# Patient Record
Sex: Male | Born: 1935 | Race: White | Hispanic: No | Marital: Married
Health system: Southern US, Community
[De-identification: ages and names within clinical notes are randomized; demographics above are authoritative.]

---

## 2014-03-18 LAB — COMPREHENSIVE METABOLIC PANEL
ALT: 18 U/L (ref 12–78)
Albumin: 2.8 g/dL — ABNORMAL LOW (ref 3.4–5.0)
Alkaline Phosphatase: 52 U/L
Anion Gap: 5 — ABNORMAL LOW (ref 7–16)
BILIRUBIN TOTAL: 0.3 mg/dL (ref 0.2–1.0)
BUN: 22 mg/dL — ABNORMAL HIGH (ref 7–18)
CALCIUM: 8.8 mg/dL (ref 8.5–10.1)
CHLORIDE: 106 mmol/L (ref 98–107)
CREATININE: 1.22 mg/dL (ref 0.60–1.30)
Co2: 31 mmol/L (ref 21–32)
EGFR (African American): >60
GFR CALC NON AF AMER: 57 — AB
Glucose: 109 mg/dL — ABNORMAL HIGH (ref 65–99)
OSMOLALITY: 287 (ref 275–301)
POTASSIUM: 4 mmol/L (ref 3.5–5.1)
SGOT(AST): 21 U/L (ref 15–37)
Sodium: 142 mmol/L (ref 136–145)
TOTAL PROTEIN: 7.3 g/dL (ref 6.4–8.2)

## 2014-03-18 LAB — CBC WITH DIFFERENTIAL/PLATELET
BASOS ABS: 0 10*3/uL (ref 0.0–0.1)
Basophil %: 0.6 %
EOS ABS: 0.3 10*3/uL (ref 0.0–0.7)
Eosinophil %: 4.2 %
HCT: 45.2 % (ref 40.0–52.0)
HGB: 14.9 g/dL (ref 13.0–18.0)
LYMPHS ABS: 1.3 10*3/uL (ref 1.0–3.6)
Lymphocyte %: 17.2 %
MCH: 29.6 pg (ref 26.0–34.0)
MCHC: 32.9 g/dL (ref 32.0–36.0)
MCV: 90 fL (ref 80–100)
Monocyte #: 0.9 x10 3/mm (ref 0.2–1.0)
Monocyte %: 12.3 %
NEUTROS PCT: 65.7 %
Neutrophil #: 5 10*3/uL (ref 1.4–6.5)
PLATELETS: 180 10*3/uL (ref 150–440)
RBC: 5.03 10*6/uL (ref 4.40–5.90)
RDW: 16.6 % — ABNORMAL HIGH (ref 11.5–14.5)
WBC: 7.6 10*3/uL (ref 3.8–10.6)

## 2014-03-18 LAB — URINALYSIS, COMPLETE
BACTERIA: NONE SEEN
Bilirubin,UR: NEGATIVE
GLUCOSE, UR: NEGATIVE mg/dL (ref 0–75)
Ketone: NEGATIVE
Leukocyte Esterase: NEGATIVE
Nitrite: NEGATIVE
PH: 5 (ref 4.5–8.0)
Protein: 100
SPECIFIC GRAVITY: 1.016 (ref 1.003–1.030)
Squamous Epithelial: NONE SEEN
WBC UR: 1 /HPF (ref 0–5)

## 2014-03-18 LAB — TSH: THYROID STIMULATING HORM: 3.38 u[IU]/mL

## 2014-03-18 LAB — CK-MB: CK-MB: 3.1 ng/mL (ref 0.5–3.6)

## 2014-03-18 LAB — TROPONIN I
Troponin-I: <0.02 ng/mL
Troponin-I: 0.05 ng/mL

## 2014-03-18 LAB — D-DIMER(ARMC): D-Dimer: 1696 ng/ml

## 2014-03-19 ENCOUNTER — Inpatient Hospital Stay: Payer: Self-pay | Admitting: Student

## 2014-03-19 LAB — CK-MB: CK-MB: 3.3 ng/mL (ref 0.5–3.6)

## 2014-03-19 LAB — TROPONIN I: Troponin-I: 0.04 ng/mL

## 2014-03-20 ENCOUNTER — Ambulatory Visit (HOSPITAL_COMMUNITY)
Admission: AD | Admit: 2014-03-20 | Discharge: 2014-03-20 | Disposition: A | Discharge status: Home / Self Care | Payer: Medicare Other | Source: Other Acute Inpatient Hospital | Attending: Student | Admitting: Student

## 2014-03-20 DIAGNOSIS — R0902 Hypoxemia: Secondary | ICD-10-CM | POA: Insufficient documentation

## 2015-01-14 IMAGING — CT CT ANGIO CHEST
1 of 2 series · 17 of 30 positions shown · IV contrast (APPLIED)
Comparison: Chest radiograph dated 03/18/2014

ADDENDUM:
Enlargement of the ascending thoracic aorta, measuring up to 4.8 cm.
CLINICAL DATA: 78-year-old with dementia and low oxygen saturation.

EXAM:
CT ANGIOGRAPHY CHEST WITH CONTRAST
TECHNIQUE: Multidetector CT imaging of the chest was performed using the
standard protocol during bolus administration of intravenous
contrast. Multiplanar CT image reconstructions and MIPs were
obtained to evaluate the vascular anatomy.
CONTRAST:  100 mL Isovue 370

[Series 5: pe 1.0 thins · axial · 0.78mm/px · z∈[-526,-253]mm · 17 of 309 slices shown]
[im 18/309  lung]
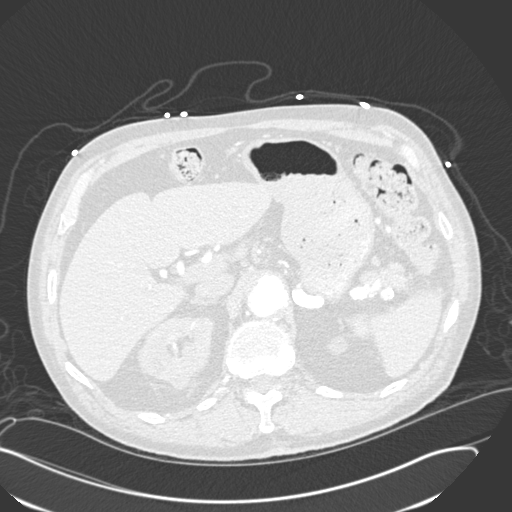
[im 35/309  mediastinal]
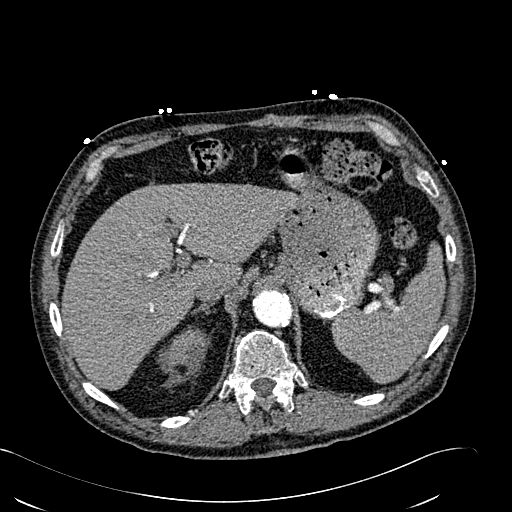
[im 52/309  lung]
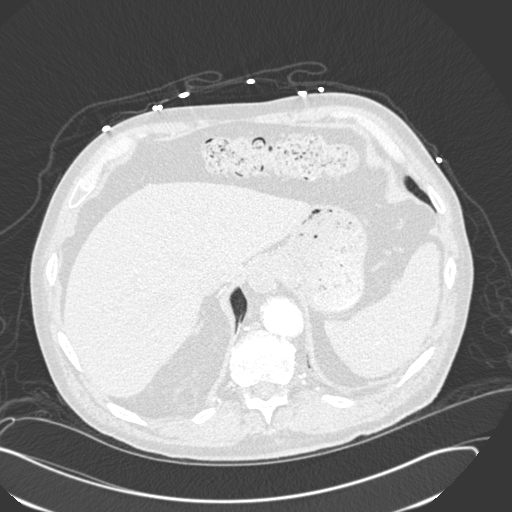
[im 69/309  mediastinal]
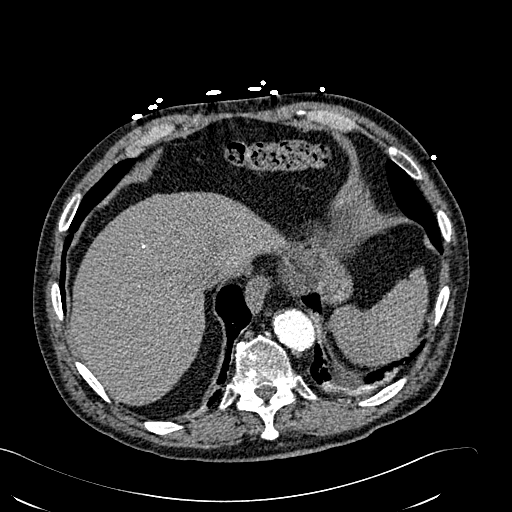
[im 86/309  lung]
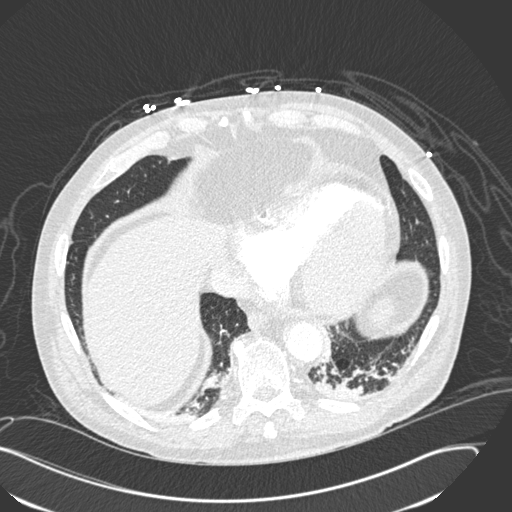
[im 103/309  mediastinal]
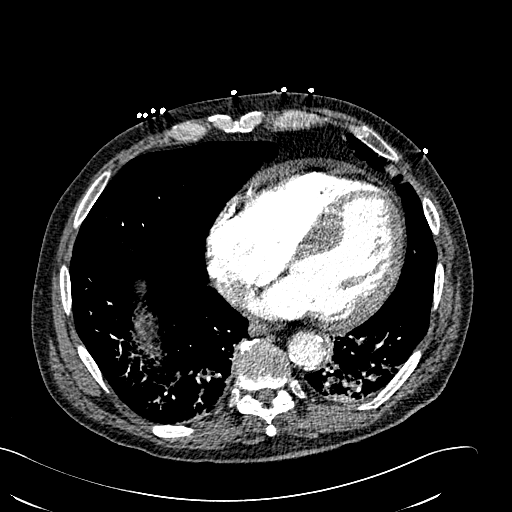
[im 120/309  lung]
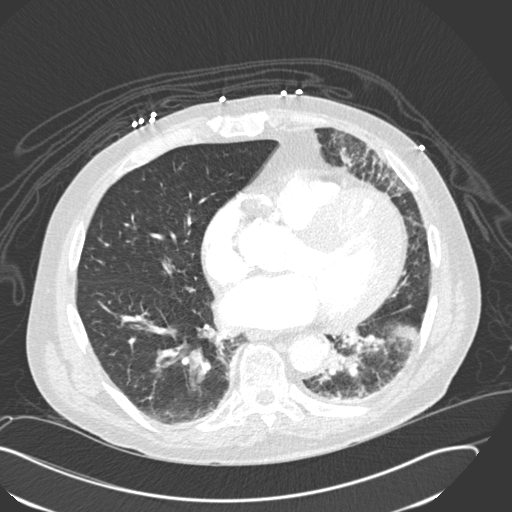
[im 137/309  mediastinal]
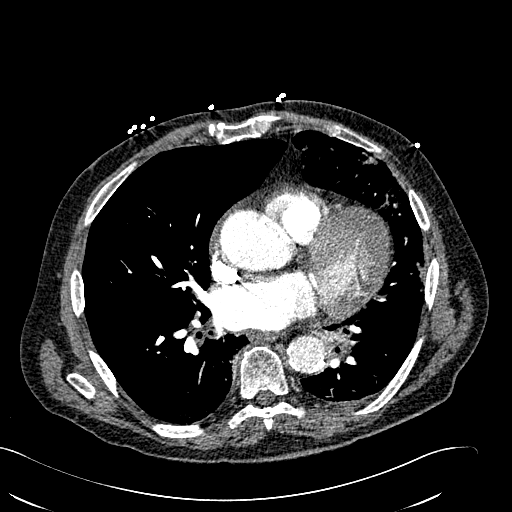
[im 155/309  lung]
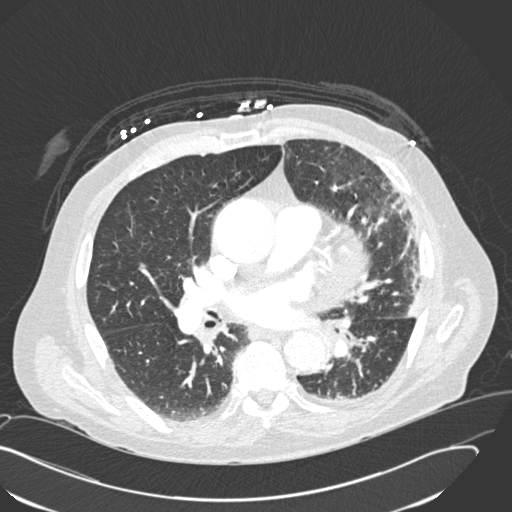
[im 172/309  mediastinal]
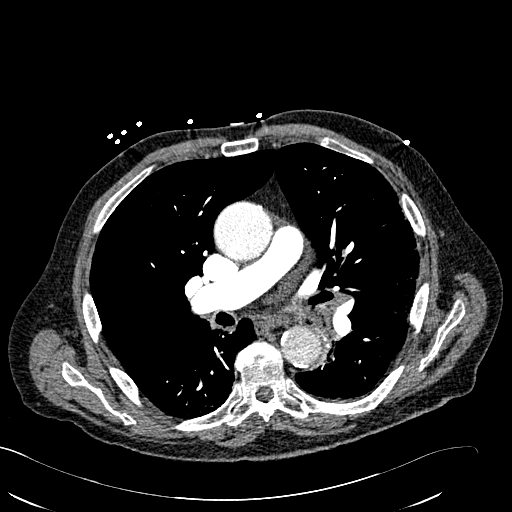
[im 189/309  lung]
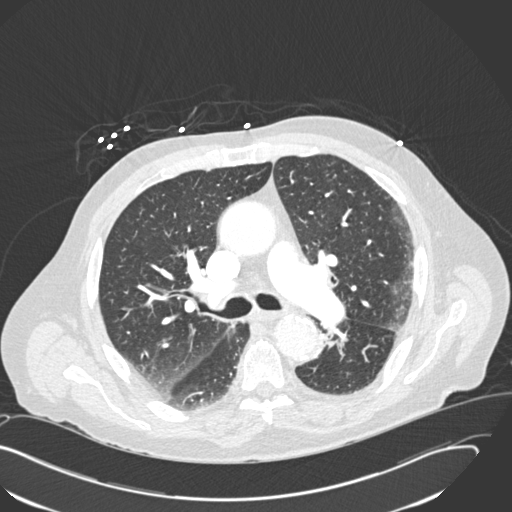
[im 206/309  mediastinal]
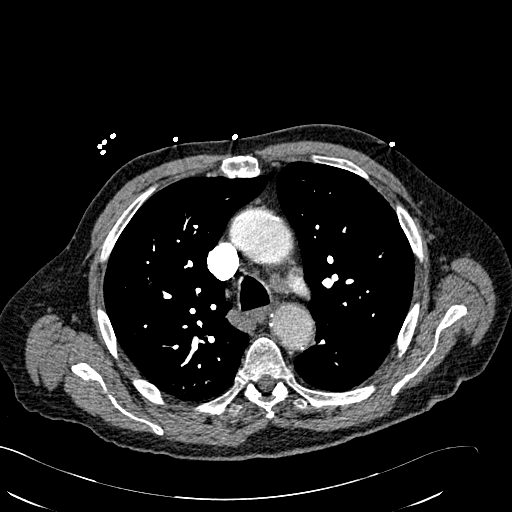
[im 223/309  lung]
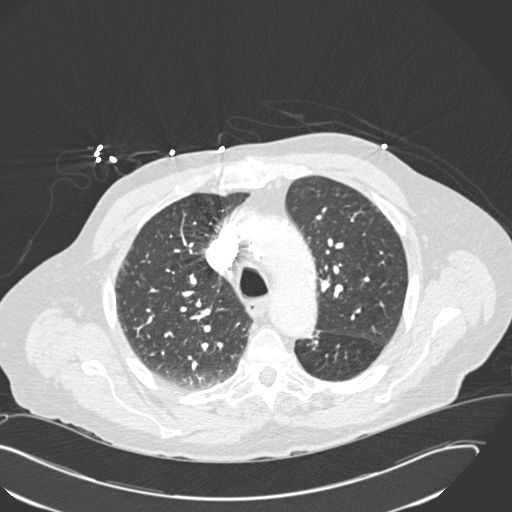
[im 240/309  mediastinal]
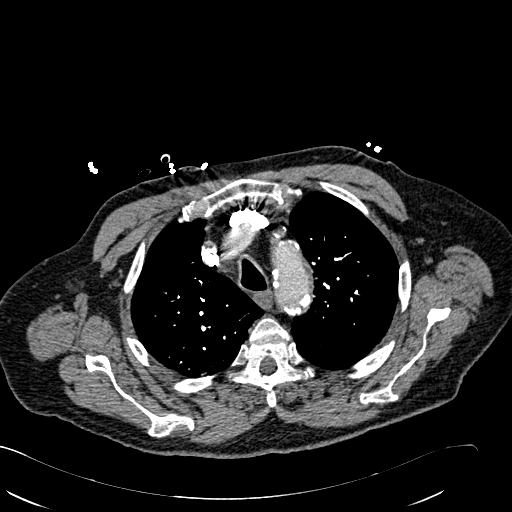
[im 257/309  lung]
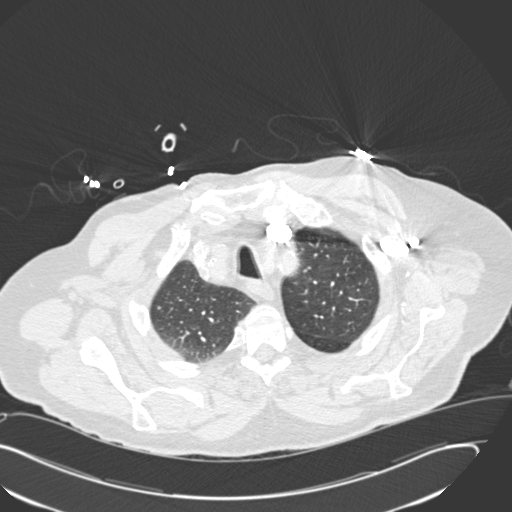
[im 274/309  mediastinal]
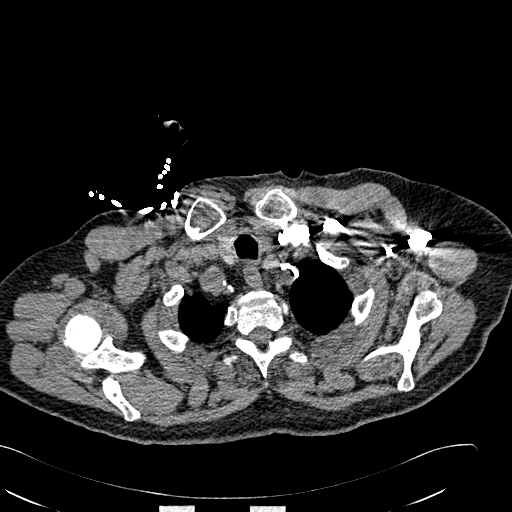
[im 291/309  lung]
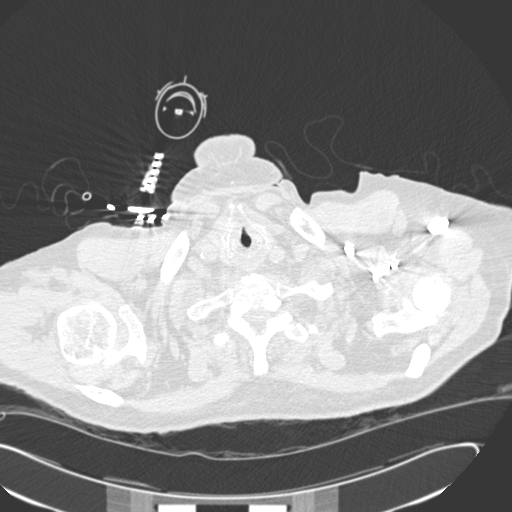

[17 of 30 positions shown; findings below may reference images not displayed]

FINDINGS: The pulmonary arteries are well opacified on this examination. There
is no evidence for a pulmonary embolism. The coronary arteries are
heavily calcified. The ascending thoracic aorta is enlarged,
measuring up to 4.8 cm. Aortic arch roughly measures 3.7 cm in
diameter. The proximal descending thoracic aorta measures 3.3 cm.
Small amount of pericardial fluid. No significant pleural fluid.
Calcified gallstone is present. Low-density structure in left kidney
upper pole is incompletely visualized but probably represents a
cyst. There are at least 3 low-density structures in the right
kidney upper pole which could represent cysts. However, there is a
hyperdense exophytic lesion in the right kidney upper pole that 38
Hounsfield units and measures 1.6 cm. This is a complex lesion and
neoplasm cannot be excluded. There may also be a complex lesion
along the posterior upper pole of the right kidney on sequence 4,
image 146.

The trachea and mainstem bronchi are patent. There is a lesion along
the superior right major fissure and along the posterior right upper
lobe. This structure measures up to 1.1 cm on the axial images but
appears to be more linear or flattened on the coronal and sagittal
reformats. 3 mm nodule in the right upper lobe on sequence 6, image
39. There is peribronchial thickening in the lower lobes
bilaterally. In addition, there are air-fluid levels within the
lower lobe airways, particularly on the left side. Findings are
concerning for aspiration. There is volume loss with patchy
parenchymal densities at lung bases, left side greater than right.
There are peripheral ground-glass densities in the lateral left
upper lung with reticular densities and subpleural thickening. This
is mild subpleural reticular densities in the right upper lobe. No
acute bone abnormality. Surgical anchors in the proximal right
humerus.

Review of the MIP images confirms the above findings.
IMPRESSION: Negative for an acute pulmonary embolism.

Peripheral ground-glass densities in the left upper lung with
reticular densities. Findings are concerning for an acute infectious
or inflammatory process. There is also peribronchial thickening in
the lower lobes with air-fluid levels. These findings raise concern
for aspiration.

There are two indeterminate lesions in the right kidney and small
neoplasms cannot be excluded. Recommend further evaluation of the
renal lesions with MRI (with and without contrast).

1.1 cm flat lesion along the posterior right upper lobe and 0.3 cm
nodule in the anterior right upper lobe. These densities are
nonspecific. The more flattened lesion could be related to
atelectasis and/or scar. Recommend a short-term follow-up in 3
months to ensure stability or resolution of this 1.1 cm lesion. The
peripheral opacities in the left upper lung could also be evaluated
with the 3 month follow-up.

Coronary artery calcifications.

Cholelithiasis.

These results will be called to the ordering clinician or
representative by the Radiologist Assistant, and communication
documented in the PACS Dashboard.
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
## 2018-03-08 DEATH — deceased
